# Patient Record
Sex: Male | Born: 1995 | Race: Black or African American | Hispanic: Yes | Marital: Single
Health system: Southern US, Community
[De-identification: ages and names within clinical notes are randomized; demographics above are authoritative.]

---

## 2019-09-23 ENCOUNTER — Ambulatory Visit: Payer: BC Managed Care – PPO | Attending: Internal Medicine

## 2019-09-23 DIAGNOSIS — Z20822 Contact with and (suspected) exposure to covid-19: Secondary | ICD-10-CM

## 2019-09-24 LAB — NOVEL CORONAVIRUS, NAA: SARS-CoV-2, NAA: NOT DETECTED

## 2020-02-29 ENCOUNTER — Other Ambulatory Visit: Payer: Self-pay

## 2020-02-29 ENCOUNTER — Other Ambulatory Visit: Payer: BC Managed Care – PPO

## 2020-02-29 DIAGNOSIS — Z20822 Contact with and (suspected) exposure to covid-19: Secondary | ICD-10-CM

## 2020-03-01 LAB — SARS-COV-2, NAA 2 DAY TAT

## 2020-03-01 LAB — NOVEL CORONAVIRUS, NAA: SARS-CoV-2, NAA: NOT DETECTED

## 2020-07-05 ENCOUNTER — Emergency Department (HOSPITAL_COMMUNITY): Payer: BC Managed Care – PPO

## 2020-07-05 ENCOUNTER — Emergency Department (HOSPITAL_COMMUNITY)
Admission: EM | Admit: 2020-07-05 | Discharge: 2020-07-05 | Disposition: A | Discharge status: Home / Self Care | Payer: BC Managed Care – PPO | Attending: Emergency Medicine | Admitting: Emergency Medicine

## 2020-07-05 ENCOUNTER — Other Ambulatory Visit: Payer: Self-pay

## 2020-07-05 DIAGNOSIS — M25562 Pain in left knee: Secondary | ICD-10-CM | POA: Diagnosis not present

## 2020-07-05 DIAGNOSIS — M25519 Pain in unspecified shoulder: Secondary | ICD-10-CM | POA: Insufficient documentation

## 2020-07-05 DIAGNOSIS — Y9389 Activity, other specified: Secondary | ICD-10-CM | POA: Insufficient documentation

## 2020-07-05 DIAGNOSIS — M542 Cervicalgia: Secondary | ICD-10-CM | POA: Diagnosis present

## 2020-07-05 DIAGNOSIS — M25561 Pain in right knee: Secondary | ICD-10-CM | POA: Diagnosis not present

## 2020-07-05 DIAGNOSIS — Y92410 Unspecified street and highway as the place of occurrence of the external cause: Secondary | ICD-10-CM | POA: Insufficient documentation
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
## 2020-10-08 ENCOUNTER — Emergency Department (HOSPITAL_COMMUNITY): Payer: BC Managed Care – PPO

## 2020-10-08 ENCOUNTER — Emergency Department (HOSPITAL_COMMUNITY)
Admission: EM | Admit: 2020-10-08 | Discharge: 2020-10-08 | Disposition: A | Discharge status: Home / Self Care | Payer: BC Managed Care – PPO | Attending: Emergency Medicine | Admitting: Emergency Medicine

## 2020-10-08 ENCOUNTER — Encounter (HOSPITAL_COMMUNITY): Payer: Self-pay

## 2020-10-08 DIAGNOSIS — I636 Cerebral infarction due to cerebral venous thrombosis, nonpyogenic: Secondary | ICD-10-CM | POA: Insufficient documentation

## 2020-10-08 DIAGNOSIS — Z20822 Contact with and (suspected) exposure to covid-19: Secondary | ICD-10-CM | POA: Insufficient documentation

## 2020-10-08 DIAGNOSIS — G43909 Migraine, unspecified, not intractable, without status migrainosus: Secondary | ICD-10-CM | POA: Insufficient documentation

## 2020-10-08 DIAGNOSIS — Z8669 Personal history of other diseases of the nervous system and sense organs: Secondary | ICD-10-CM

## 2020-10-08 LAB — BASIC METABOLIC PANEL
Anion gap: 7 (ref 5–15)
BUN: 10 mg/dL (ref 6–20)
CO2: 27 mmol/L (ref 22–32)
Calcium: 9.2 mg/dL (ref 8.9–10.3)
Chloride: 107 mmol/L (ref 98–111)
Creatinine, Ser: 1.01 mg/dL (ref 0.61–1.24)
GFR, Estimated: >60 mL/min (ref >60)
Glucose, Bld: 104 mg/dL — ABNORMAL HIGH (ref 70–99)
Potassium: 3.9 mmol/L (ref 3.5–5.1)
Sodium: 141 mmol/L (ref 135–145)

## 2020-10-08 LAB — CBC WITH DIFFERENTIAL/PLATELET
Abs Immature Granulocytes: 0.02 10*3/uL (ref 0.00–0.07)
Basophils Absolute: 0 10*3/uL (ref 0.0–0.1)
Basophils Relative: 1 %
Eosinophils Absolute: 0.2 10*3/uL (ref 0.0–0.5)
Eosinophils Relative: 3 %
HCT: 44.4 % (ref 39.0–52.0)
Hemoglobin: 15.1 g/dL (ref 13.0–17.0)
Immature Granulocytes: 0 %
Lymphocytes Relative: 37 %
Lymphs Abs: 2.2 10*3/uL (ref 0.7–4.0)
MCH: 30.4 pg (ref 26.0–34.0)
MCHC: 34 g/dL (ref 30.0–36.0)
MCV: 89.5 fL (ref 80.0–100.0)
Monocytes Absolute: 0.6 10*3/uL (ref 0.1–1.0)
Monocytes Relative: 10 %
Neutro Abs: 3 10*3/uL (ref 1.7–7.7)
Neutrophils Relative %: 49 %
Platelets: 202 10*3/uL (ref 150–400)
RBC: 4.96 MIL/uL (ref 4.22–5.81)
RDW: 13.2 % (ref 11.5–15.5)
WBC: 5.9 10*3/uL (ref 4.0–10.5)
nRBC: 0 % (ref 0.0–0.2)

## 2020-10-08 LAB — RESP PANEL BY RT-PCR (FLU A&B, COVID) ARPGX2
Influenza A by PCR: NEGATIVE
Influenza B by PCR: NEGATIVE
SARS Coronavirus 2 by RT PCR: NEGATIVE

## 2020-10-08 LAB — PROTIME-INR
INR: 1 (ref 0.8–1.2)
Prothrombin Time: 12.6 seconds (ref 11.4–15.2)

## 2020-10-08 IMAGING — MR MR [PERSON_NAME] HEAD
6 of 9 series · 39 of 48 positions shown · IV contrast (gadavist)
Comparison: Noncontrast head CT [DATE]

CLINICAL DATA: Headache, dizziness, and visual disturbance.
Possible superior sagittal sinus thrombosis on CT.

EXAM:
MRI HEAD WITHOUT AND WITH CONTRAST
MRV HEAD WITHOUT CONTRAST
TECHNIQUE: Multiplanar, multiecho pulse sequences of the brain and surrounding
structures were obtained without and with intravenous contrast.
Angiographic images of the intracranial venous structures were
obtained using MRV technique without and with intravenous contrast.
CONTRAST:  10mL GADAVIST GADOBUTROL 1 MMOL/ML IV SOLN

[Series 6: DWI · axial · 3.0mm · 1.36mm/px · z∈[-71,+70]mm · 6 of 96 slices shown (1 of 2)]
[im 1/96]
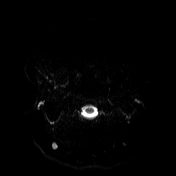
[im 20/96]
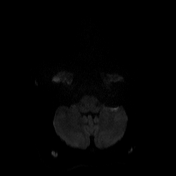
[im 39/96]
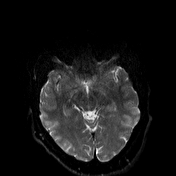
[im 58/96]
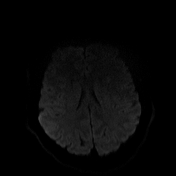
[im 77/96]
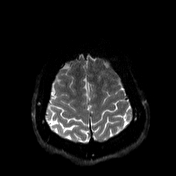
[im 96/96]
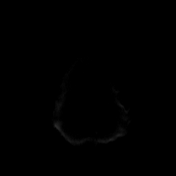

[Series 7: DWI · axial · 3.0mm · 1.36mm/px · z∈[-71,+70]mm · 2 of 48 slices shown (2 of 2)]
[im 1/48]
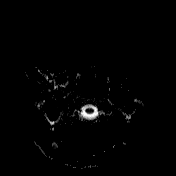
[im 48/48]
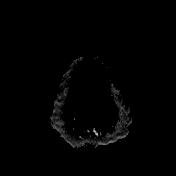

[Series 15: cor 2d · coronal · 2.5mm · 0.69mm/px · 6 of 128 slices shown]
[im 1/128]
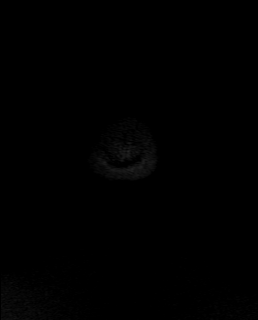
[im 26/128]
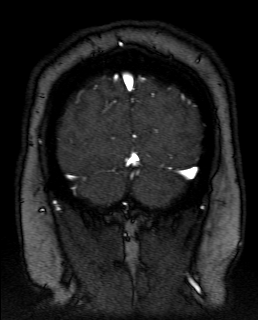
[im 51/128]
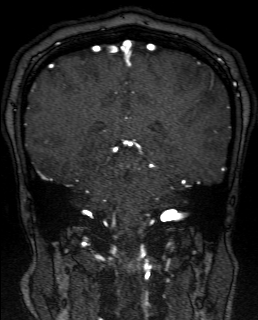
[im 77/128]
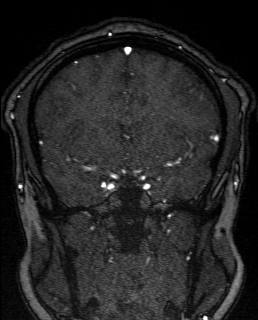
[im 102/128]
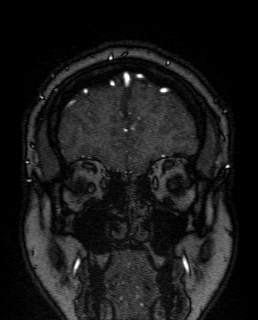
[im 128/128]
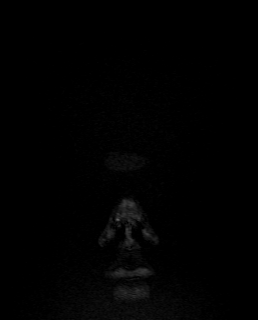

[Series 22: T1 post-contrast · sagittal · 1.0mm · 0.94mm/px · 8 of 160 slices shown (1 of 3)]
[im 1/160]
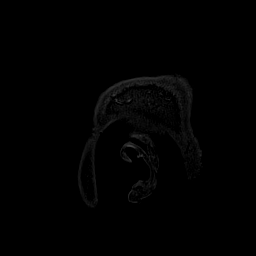
[im 23/160]
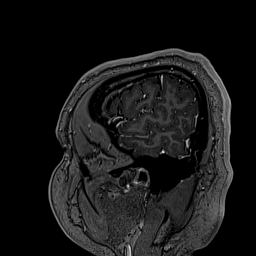
[im 46/160]
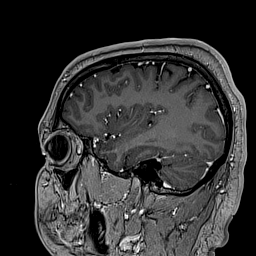
[im 69/160]
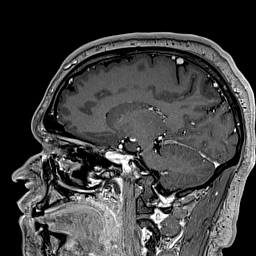
[im 91/160]
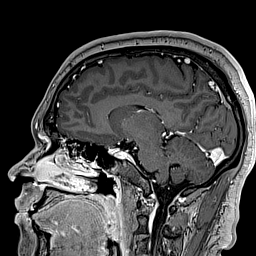
[im 114/160]
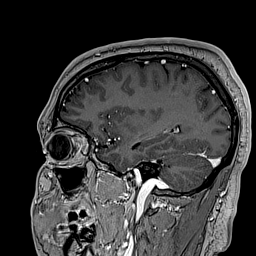
[im 137/160]
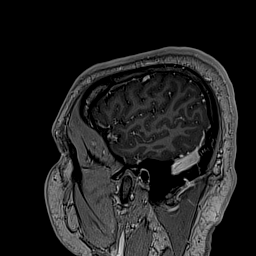
[im 160/160]
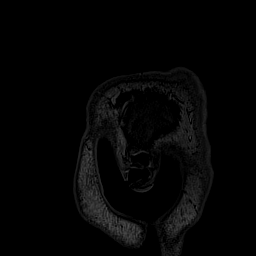

[Series 23: T1 post-contrast · coronal · 1.0mm · 0.94mm/px · 9 of 170 slices shown (2 of 3)]
[im 1/170]
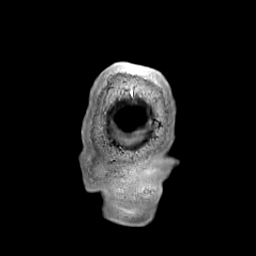
[im 22/170]
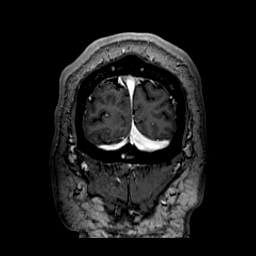
[im 43/170]
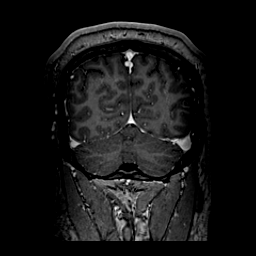
[im 64/170]
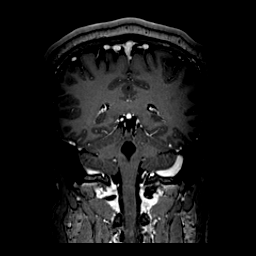
[im 85/170]
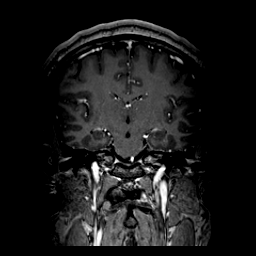
[im 106/170]
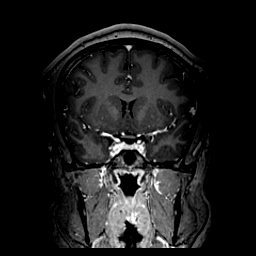
[im 127/170]
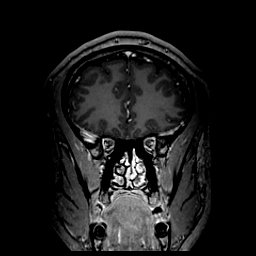
[im 148/170]
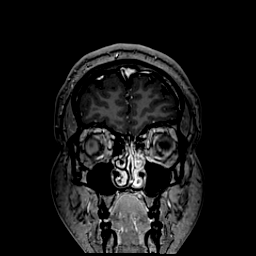
[im 170/170]
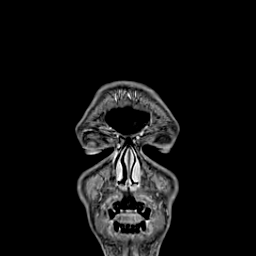

[Series 24: T1 post-contrast · axial · 1.0mm · 0.94mm/px · z∈[-79,+94]mm · 8 of 160 slices shown (3 of 3)]
[im 1/160]
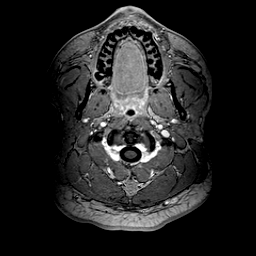
[im 23/160]
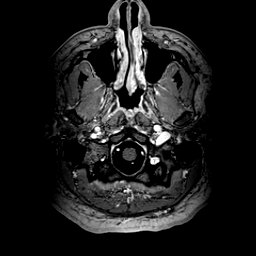
[im 46/160]
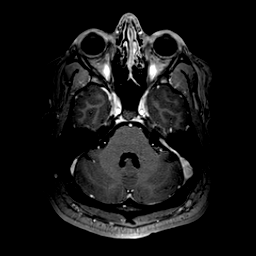
[im 69/160]
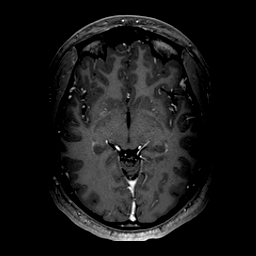
[im 91/160]
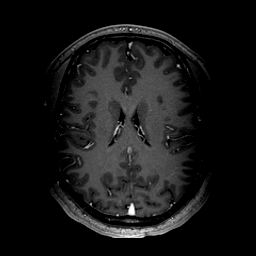
[im 114/160]
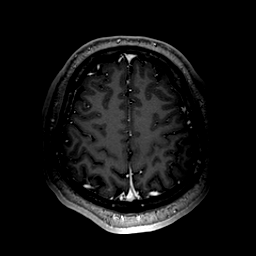
[im 137/160]
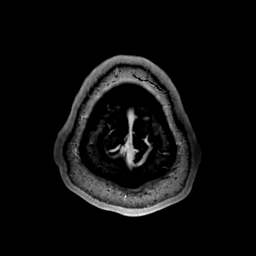
[im 160/160]
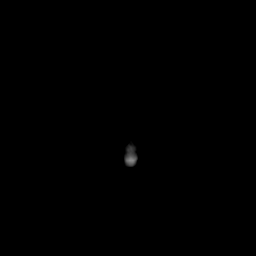

[39 of 48 positions shown; findings below may reference images not displayed]

FINDINGS: MRI HEAD:

Brain: There is no evidence of an acute infarct, intracranial
hemorrhage, mass, midline shift, or extra-axial fluid collection.
The ventricles and sulci are normal. There is a single sella mm
focus of T2 FLAIR hyperintensity in the subcortical white matter of
the anteroinferior right frontal lobe (series 16, image 21). The
brain is unremarkable in signal elsewhere. No abnormal enhancement
is identified. The cerebellar tonsils are normally position. The
pituitary is normal in size.

Vascular: Major intracranial vascular flow voids are preserved.

Skull and upper cervical spine: Unremarkable bone marrow signal.

Sinuses/Orbits: Unremarkable orbits. Paranasal sinuses and mastoid
air cells are clear.

Other: None.

MRV HEAD:

Superior sagittal sinus, internal cerebral veins, vein of TRIPTI,
straight sinus, transverse sinuses, sigmoid sinuses, and jugular
bulbs are patent without evidence of thrombus or stenosis. The left
transverse and sigmoid sinuses are dominant.
IMPRESSION: 1. No acute intracranial abnormality.
2. Single small focus of gliosis in the right frontal white matter
compatible with a nonspecific remote insult.
3. Negative head MRV.

## 2020-10-08 IMAGING — MR MR HEAD WO/W CM
14 of 18 series · 39 of 48 positions shown · IV contrast (gadavist)
Comparison: Noncontrast head CT [DATE]

CLINICAL DATA: Headache, dizziness, and visual disturbance.
Possible superior sagittal sinus thrombosis on CT.

EXAM:
MRI HEAD WITHOUT AND WITH CONTRAST
MRV HEAD WITHOUT CONTRAST
TECHNIQUE: Multiplanar, multiecho pulse sequences of the brain and surrounding
structures were obtained without and with intravenous contrast.
Angiographic images of the intracranial venous structures were
obtained using MRV technique without and with intravenous contrast.
CONTRAST:  10mL GADAVIST GADOBUTROL 1 MMOL/ML IV SOLN

[Series 5: DWI · axial · 3.0mm · 1.36mm/px · z∈[-71,+70]mm · 4 of 96 slices shown (1 of 2)]
[im 1/96]
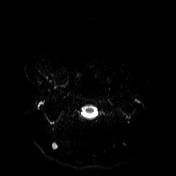
[im 32/96]
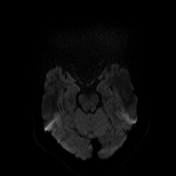
[im 64/96]
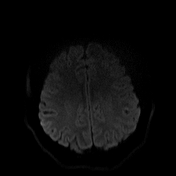
[im 96/96]
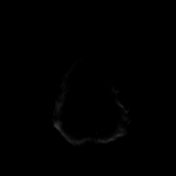

[Series 6: DWI · axial · 3.0mm · 1.36mm/px · 1 of 48 slices shown (2 of 2)]
[im 1/48]
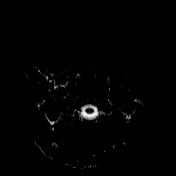

[Series 12: T1 · sagittal · 5.0mm · 0.75mm/px · 1 of 27 slices shown (1 of 2)]
[im 1/27]
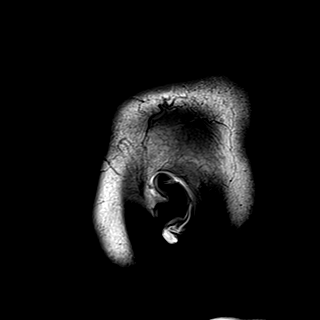

[Series 13: T2 · axial · 5.0mm · 0.62mm/px · 1 of 26 slices shown]
[im 1/26]
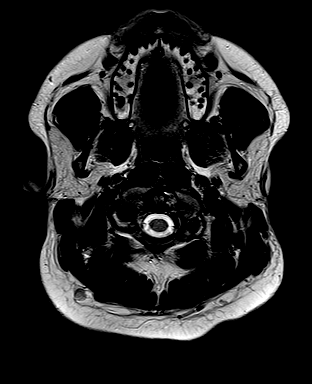

[Series 14: mip_images(sw) · axial · 24.0mm · 0.75mm/px · z∈[-57,+74]mm · 2 of 45 slices shown]
[im 1/45]
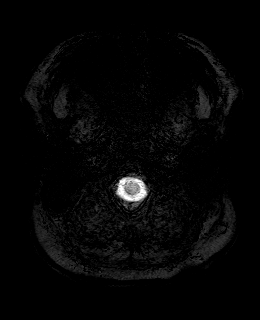
[im 45/45]
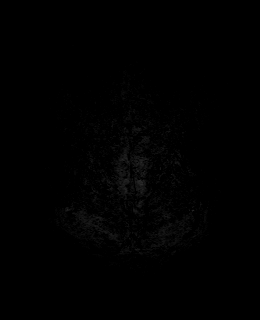

[Series 15: swi_images · axial · 3.0mm · 0.75mm/px · z∈[-67,+85]mm · 3 of 52 slices shown]
[im 1/52]
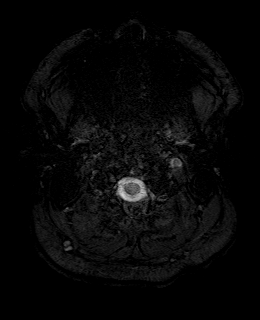
[im 26/52]
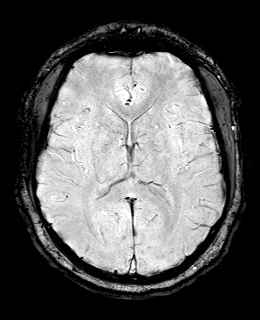
[im 52/52]
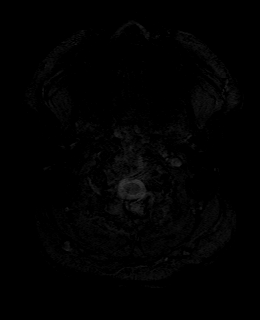

[Series 16: FLAIR · axial · 3.0mm · 0.75mm/px · z∈[-67,+85]mm · 3 of 52 slices shown]
[im 1/52]
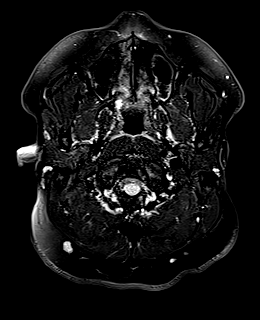
[im 26/52]
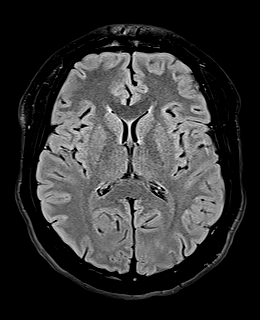
[im 52/52]
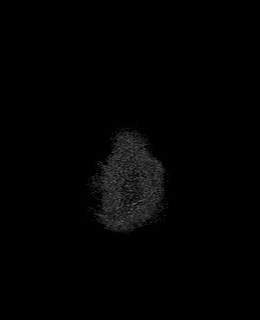

[Series 17: T1 · axial · 1.0mm · 0.94mm/px · z∈[-67,+75]mm · 7 of 144 slices shown (2 of 2)]
[im 1/144]
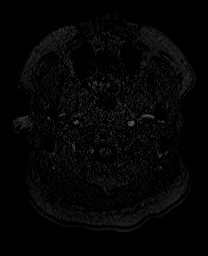
[im 24/144]
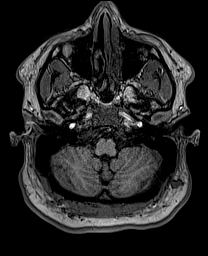
[im 48/144]
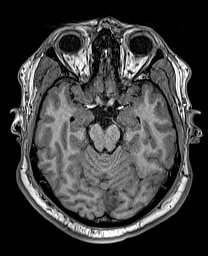
[im 72/144]
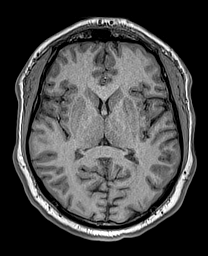
[im 96/144]
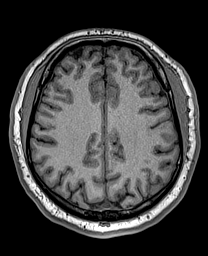
[im 120/144]
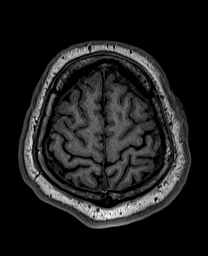
[im 144/144]
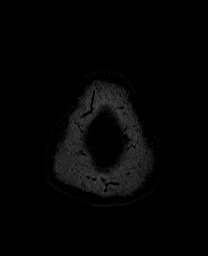

[Series 18: cor dwi_tracew · coronal · 5.0mm · 1.53mm/px · 3 of 60 slices shown]
[im 1/60]
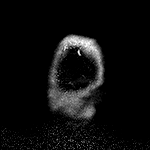
[im 30/60]
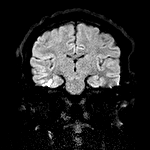
[im 60/60]
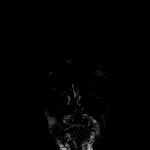

[Series 19: cor dwi_adc · coronal · 5.0mm · 1.53mm/px · 2 of 30 slices shown]
[im 1/30]
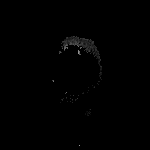
[im 30/30]
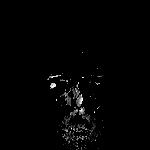

[Series 20: T2 post-contrast · coronal · 5.0mm · 0.57mm/px · 2 of 32 slices shown]
[im 1/32]
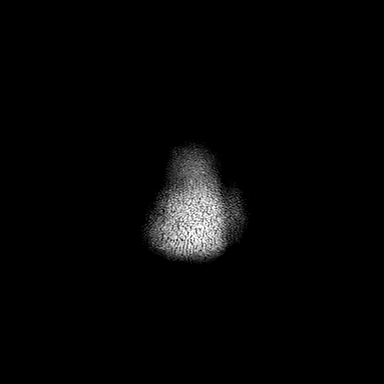
[im 32/32]
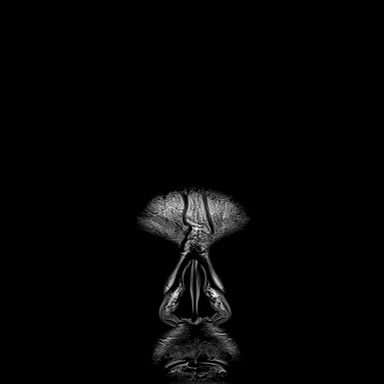

[Series 21: T1 post-contrast · axial · 1.0mm · 0.94mm/px · z∈[-67,+75]mm · 7 of 144 slices shown (1 of 3)]
[im 1/144]
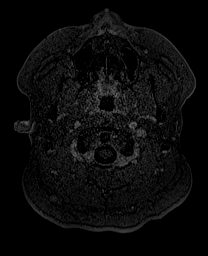
[im 24/144]
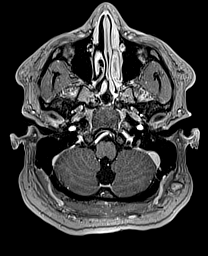
[im 48/144]
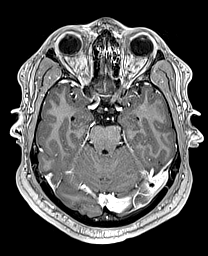
[im 72/144]
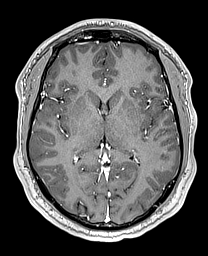
[im 96/144]
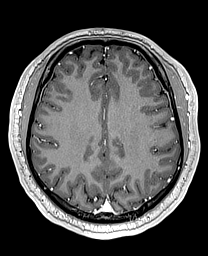
[im 120/144]
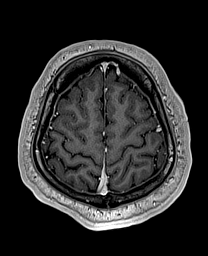
[im 144/144]
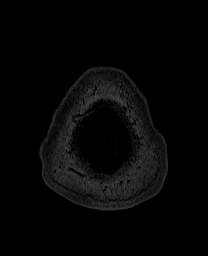

[Series 22: T1 post-contrast · coronal · 5.0mm · 0.43mm/px · 2 of 32 slices shown (2 of 3)]
[im 1/32]
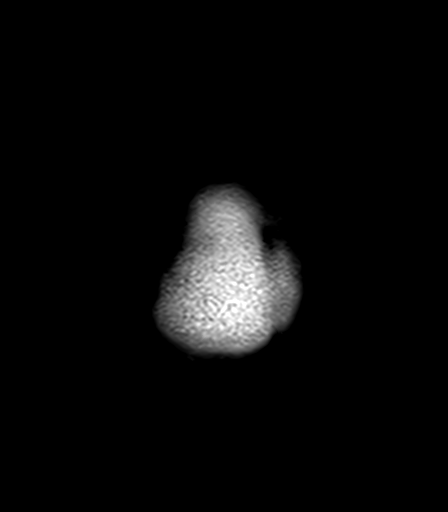
[im 32/32]
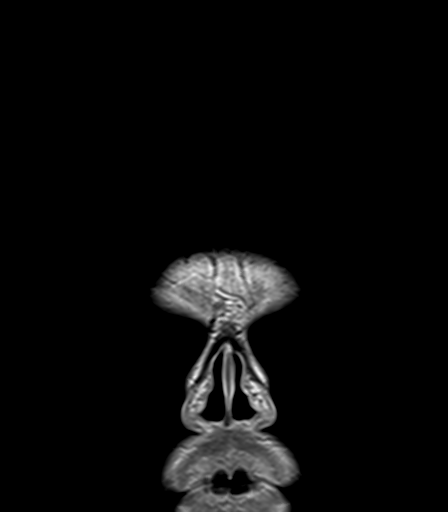

[Series 23: T1 post-contrast · sagittal · 5.0mm · 0.75mm/px · 1 of 27 slices shown (3 of 3)]
[im 1/27]
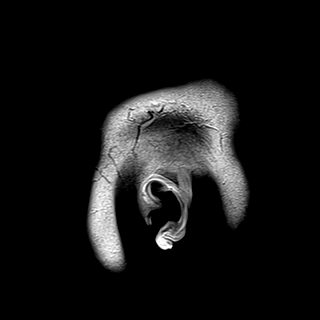

[39 of 48 positions shown; findings below may reference images not displayed]

FINDINGS: MRI HEAD:

Brain: There is no evidence of an acute infarct, intracranial
hemorrhage, mass, midline shift, or extra-axial fluid collection.
The ventricles and sulci are normal. There is a single sella mm
focus of T2 FLAIR hyperintensity in the subcortical white matter of
the anteroinferior right frontal lobe (series 16, image 21). The
brain is unremarkable in signal elsewhere. No abnormal enhancement
is identified. The cerebellar tonsils are normally position. The
pituitary is normal in size.

Vascular: Major intracranial vascular flow voids are preserved.

Skull and upper cervical spine: Unremarkable bone marrow signal.

Sinuses/Orbits: Unremarkable orbits. Paranasal sinuses and mastoid
air cells are clear.

Other: None.

MRV HEAD:

Superior sagittal sinus, internal cerebral veins, vein of TRIPTI,
straight sinus, transverse sinuses, sigmoid sinuses, and jugular
bulbs are patent without evidence of thrombus or stenosis. The left
transverse and sigmoid sinuses are dominant.
IMPRESSION: 1. No acute intracranial abnormality.
2. Single small focus of gliosis in the right frontal white matter
compatible with a nonspecific remote insult.
3. Negative head MRV.

## 2020-10-08 MED ORDER — GADOBUTROL 1 MMOL/ML IV SOLN
10.0000 mL | Freq: Once | INTRAVENOUS | Status: AC | PRN
  Administered 2020-10-08: 10 mL via INTRAVENOUS

## 2020-10-08 MED ORDER — KETOROLAC TROMETHAMINE 15 MG/ML IJ SOLN: 15.0000 mg | Freq: Once | INTRAMUSCULAR | Status: DC

## 2020-10-08 NOTE — Discharge Instructions (Addendum)
Your MRI shows no acute or concerning findings today. Please follow with your PCP and neurology.

## 2020-10-08 NOTE — ED Triage Notes (Signed)
Pt presents with c/o needing an MRI of the brain. Pt reports he had a CT that showed "indeterminate appearance of the superior sinus with possibility of dural sinus thrombosis with recommended brain MRI." After this CT, pt was told to come to the ER for said MRI. Pt denies any pain. Pt reports he was originally seen for headache and vision impairment.

## 2020-10-08 NOTE — ED Provider Notes (Signed)
[facility]-EMERGENCY DEPT Provider Note   CSN: [id number] Arrival date & time: 10/08/20  1420     History Chief Complaint  Patient presents with  . Needs MRI     Joel Hood is a 25 y.o. male with PMHx IBS, chronic migraine HA, tobacco use, presenting to the ED for MRI imaging of his brain. He was seen at [facility] on 10/06/20 for migraine HA with blurry vision. He reports history of frequent migraine HA, almost daily. He states on Thursday he began having a typical HA consisting of dull aching pain behind his right eye. He then had transient blurry vision in the right eye which ended up switching to the left and then completely resolved that evening. He states when he woke up HA persisted but was not severe. He went to [facility] for evaluation since his HA persisted to the next day. By the time he got to [facility] that afternoon his HA was nearly gone. They ordered outpatient CT scan for further assessment considering new symptoms. CT was done today and showed:   "ADDENDUM REPORT: 10/08/2020 12:15 ADDENDUM: Study discussed by telephone with Nurse Practitioner Teressa Lower on 10/08/2020 at noon. Electronically Signed By: Odessa Fleming M.D. On: 10/08/2020 12:15 CLINICAL DATA: 25 year old male with bilateral eye pain which is atypical for the patient's migraines. Nausea vomiting. EXAM: CT HEAD WITHOUT CONTRAST TECHNIQUE: Contiguous axial images were obtained from the base of the skull through the vertex without intravenous contrast. COMPARISON: None. FINDINGS: Brain: Normal cerebral volume. No midline shift, ventriculomegaly, mass effect, evidence of mass lesion, intracranial hemorrhage or evidence of cortically based acute infarction. Gray-white matter differentiation is within normal limits throughout the brain. Vascular: Conspicuous density of the superior sagittal sinus and some adjacent superior draining cortical veins is noted (coronal image 45). But other  intracranial vascular density appears normal. Skull: Negative. Sinuses/Orbits: Visualized paranasal sinuses and mastoids are clear. Other: Visualized orbit soft tissues are within normal limits. Visualized scalp soft tissues are within normal limits. IMPRESSION: 1. Indeterminate plain CT appearance of the superior sagittal sinus. Consider the possibility of dural sinus thrombosis in the appropriate clinical setting and follow-up with Brain MRI without and with contrast (and ideally also MRV with contrast) if there is suspicion. 2. Otherwise normal noncontrast Head CT. Electronically Signed by: Odessa Fleming M.D.  On: 10/08/2020 11:39"   He states currently he has a very mild HA, no vision changes since the occurrence of Thursday. No other neurologic symptoms. No hx clot, recent surgery/trauma, recent illness, no clotting d/o. No hx cancer. No fevers. Does endorse daily tobacco use, 6-7 per day.   The history is provided by the patient and medical records.       History reviewed. No pertinent past medical history.  There are no problems to display for this patient.   History reviewed. No pertinent surgical history.     History reviewed. No pertinent family history.     Home Medications Prior to Admission medications   Not on File    Allergies    Patient has no known allergies.  Review of Systems   Review of Systems  All other systems reviewed and are negative.   Physical Exam Updated Vital Signs BP (!) 124/93   Pulse 80   Temp 98.1 F (36.7 C) (Oral)   Resp 20   Ht 5\' 11"  (1.803 m)   Wt 99.8 kg   SpO2 96%   BMI 30.68 kg/m   Physical Exam Vitals and nursing note  reviewed.  Constitutional:      General: He is not in acute distress.    Appearance: He is well-developed.  HENT:     Head: Normocephalic and atraumatic.  Eyes:     Conjunctiva/sclera: Conjunctivae normal.  Cardiovascular:     Rate and Rhythm: Normal rate and regular rhythm.  Pulmonary:      Effort: Pulmonary effort is normal.     Breath sounds: Normal breath sounds.  Abdominal:     Palpations: Abdomen is soft.  Musculoskeletal:     Cervical back: Normal range of motion.  Skin:    General: Skin is warm.  Neurological:     Mental Status: He is alert.     Comments: Mental Status:  Alert, oriented, thought content appropriate, able to give a coherent history. Speech fluent without evidence of aphasia. Able to follow 2 step commands without difficulty.  Cranial Nerves:  II:  Peripheral visual fields grossly normal, pupils equal, round, reactive to light III,IV, VI: ptosis not present, extra-ocular motions intact bilaterally  V,VII: smile symmetric, facial light touch sensation equal VIII: hearing grossly normal to voice  X: uvula elevates symmetrically  XI: bilateral shoulder shrug symmetric and strong XII: midline tongue extension without fassiculations Motor:  Normal tone. 5/5 strength in upper and lower extremities bilaterally including strong and equal grip strength and dorsiflexion/plantar flexion Sensory: grossly normal in all extremities.  Cerebellar: normal finger-to-nose with bilateral upper extremities Gait: normal gait and balance CV: distal pulses palpable throughout    Psychiatric:        Behavior: Behavior normal.     ED Results / Procedures / Treatments   Labs (all labs ordered are listed, but only abnormal results are displayed) Labs Reviewed  BASIC METABOLIC PANEL - Abnormal; Notable for the following components:      Result Value   Glucose, Bld 104 (*)    All other components within normal limits  RESP PANEL BY RT-PCR (FLU A&B, COVID) ARPGX2  CBC WITH DIFFERENTIAL/PLATELET  PROTIME-INR    EKG None  Radiology MR Brain W and Wo Contrast  Result Date: 10/08/2020 CLINICAL DATA:  Headache, dizziness, and visual disturbance. 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 EXAM: MRI HEAD WITHOUT AND WITH CONTRAST MRV HEAD WITHOUT CONTRAST  TECHNIQUE: Multiplanar, multiecho pulse sequences of the brain and surrounding structures were obtained without and with intravenous contrast. Angiographic images of the intracranial venous structures were obtained using MRV technique without and with intravenous contrast. CONTRAST:  23mL GADAVIST GADOBUTROL 1 MMOL/ML IV SOLN COMPARISON:  Noncontrast head CT 10/08/2020 FINDINGS: MRI HEAD: Brain: There is no evidence of an acute infarct, intracranial hemorrhage, mass, midline shift, or extra-axial fluid collection. The ventricles and sulci are normal. There is a single sella mm focus of T2 FLAIR hyperintensity in the subcortical white matter of the anteroinferior right frontal lobe (series 16, image 21). The brain is unremarkable in signal elsewhere. No abnormal enhancement is identified. The cerebellar tonsils are normally position. The pituitary is normal in size. Vascular: Major intracranial vascular flow voids are preserved. Skull and upper cervical spine: Unremarkable bone marrow signal. Sinuses/Orbits: Unremarkable orbits. Paranasal sinuses and mastoid air cells are clear. Other: None. MRV HEAD: Superior sagittal sinus, internal cerebral veins, vein of Galen, straight sinus, transverse sinuses, sigmoid sinuses, and jugular bulbs are patent without evidence of thrombus or stenosis. The left transverse and sigmoid sinuses are dominant. IMPRESSION: 1. No acute intracranial abnormality. 2. Single small focus of gliosis in the right frontal white matter compatible  with a nonspecific remote insult. 3. Negative head MRV. Electronically Signed   By: Sebastian Ache M.D.   On: 10/08/2020 18:26   MR Venogram Head  Result Date: 10/08/2020 CLINICAL DATA:  Headache, dizziness, and visual disturbance. 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 EXAM: MRI HEAD WITHOUT AND WITH CONTRAST MRV HEAD WITHOUT CONTRAST TECHNIQUE: Multiplanar, multiecho pulse sequences of the brain and surrounding structures were obtained  without and with intravenous contrast. Angiographic images of the intracranial venous structures were obtained using MRV technique without and with intravenous contrast. CONTRAST:  45mL GADAVIST GADOBUTROL 1 MMOL/ML IV SOLN COMPARISON:  Noncontrast head CT 10/08/2020 FINDINGS: MRI HEAD: Brain: There is no evidence of an acute infarct, intracranial hemorrhage, mass, midline shift, or extra-axial fluid collection. The ventricles and sulci are normal. There is a single sella mm focus of T2 FLAIR hyperintensity in the subcortical white matter of the anteroinferior right frontal lobe (series 16, image 21). The brain is unremarkable in signal elsewhere. No abnormal enhancement is identified. The cerebellar tonsils are normally position. The pituitary is normal in size. Vascular: Major intracranial vascular flow voids are preserved. Skull and upper cervical spine: Unremarkable bone marrow signal. Sinuses/Orbits: Unremarkable orbits. Paranasal sinuses and mastoid air cells are clear. Other: None. MRV HEAD: Superior sagittal sinus, internal cerebral veins, vein of Galen, straight sinus, transverse sinuses, sigmoid sinuses, and jugular bulbs are patent without evidence of thrombus or stenosis. The left transverse and sigmoid sinuses are dominant. IMPRESSION: 1. No acute intracranial abnormality. 2. Single small focus of gliosis in the right frontal white matter compatible with a nonspecific remote insult. 3. Negative head MRV. Electronically Signed   By: Sebastian Ache M.D.   On: 10/08/2020 18:26    Procedures Procedures   Medications Ordered in ED Medications  gadobutrol (GADAVIST) 1 MMOL/ML injection 10 mL (10 mLs Intravenous Contrast Given 10/08/20 1706)    ED Course  I have reviewed the triage vital signs and the nursing notes.  Pertinent labs & imaging results that were available during my care of the patient were reviewed by me and considered in my medical decision making (see chart for details).    MDM  Rules/Calculators/A&P                          Patient with history of chronic migraine headaches, presenting to the ED for MRI imaging after suspicious findings on outpatient CT scan today.  Was seen in urgent care couple of days ago for typical migraine headache however having some atypical features with transient blurry vision.  CT scan today was specialist for dural venous thrombosis.  On examination he is asymptomatic, no focal neuro deficits.  MR I with and without contrast as well as MRV is obtained and is negative for any acute findings.  There was a remote insult noted, this was discussed with patient.  He was provided with neurology referral by UC, encouraged he attend for management of his chronic migraine HA. Counseled on smoking cessation. Discharged in no distress.  Discussed results, findings, treatment and follow up. Patient advised of return precautions. Patient verbalized understanding and agreed with plan.  Final Clinical Impression(s) / ED Diagnoses Final diagnoses:  History of migraine headaches    Rx / DC Orders ED Discharge Orders    None       Kharisma Glasner, [place] N, PA-C 10/08/20 1848    Wynetta Fines, MD 10/13/20 [phone]
